# Patient Record
Sex: Male | Born: 1975 | Race: White | Hispanic: No | Marital: Single | Smoking: Current every day smoker
Health system: Southern US, Community
[De-identification: ages and names within clinical notes are randomized; demographics above are authoritative.]

## PROBLEM LIST (undated history)

## (undated) DIAGNOSIS — F32A Depression, unspecified: Secondary | ICD-10-CM

## (undated) HISTORY — PX: HERNIA REPAIR: SHX51

## (undated) HISTORY — DX: Depression, unspecified: F32.A

---

## 2009-11-23 ENCOUNTER — Emergency Department (HOSPITAL_COMMUNITY): Admission: EM | Admit: 2009-11-23 | Discharge: 2009-11-23 | Payer: Self-pay | Admitting: Emergency Medicine

## 2009-12-15 ENCOUNTER — Emergency Department (HOSPITAL_COMMUNITY): Admission: EM | Admit: 2009-12-15 | Discharge: 2009-12-15 | Payer: Self-pay | Admitting: Emergency Medicine

## 2009-12-18 ENCOUNTER — Emergency Department (HOSPITAL_COMMUNITY): Admission: EM | Admit: 2009-12-18 | Discharge: 2009-12-18 | Payer: Self-pay | Admitting: Emergency Medicine

## 2009-12-24 ENCOUNTER — Emergency Department (HOSPITAL_COMMUNITY): Admission: EM | Admit: 2009-12-24 | Discharge: 2009-12-24 | Payer: Self-pay | Admitting: Emergency Medicine

## 2010-02-04 ENCOUNTER — Emergency Department (HOSPITAL_COMMUNITY): Admission: EM | Admit: 2010-02-04 | Discharge: 2010-02-04 | Payer: Self-pay | Admitting: Emergency Medicine

## 2010-06-12 ENCOUNTER — Emergency Department: Payer: Self-pay | Admitting: Emergency Medicine

## 2010-06-23 ENCOUNTER — Emergency Department: Payer: Self-pay | Admitting: Emergency Medicine

## 2010-06-24 ENCOUNTER — Emergency Department: Payer: Self-pay | Admitting: Unknown Physician Specialty

## 2010-06-28 ENCOUNTER — Encounter: Payer: Self-pay | Admitting: Family Medicine

## 2010-06-28 ENCOUNTER — Ambulatory Visit
Admission: RE | Admit: 2010-06-28 | Discharge: 2010-06-28 | Payer: Self-pay | Source: Home / Self Care | Attending: Family Medicine | Admitting: Family Medicine

## 2010-06-28 ENCOUNTER — Emergency Department: Payer: Self-pay | Admitting: Emergency Medicine

## 2010-06-28 DIAGNOSIS — F329 Major depressive disorder, single episode, unspecified: Secondary | ICD-10-CM | POA: Insufficient documentation

## 2010-06-28 DIAGNOSIS — F4310 Post-traumatic stress disorder, unspecified: Secondary | ICD-10-CM

## 2010-06-28 DIAGNOSIS — R413 Other amnesia: Secondary | ICD-10-CM
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
## 2011-08-13 IMAGING — CR DG KNEE COMPLETE 4+V*L*
1 series · 4 of 4 positions shown · non-contrast
Comparison: none

REASON FOR EXAM: pain
COMMENTS:   May transport without cardiac monitor

PROCEDURE:     DXR - DXR KNEE LT COMP WITH OBLIQUES  - June 24, 2010 [DATE]
RESULT:     Comparison: None.

[Series 1: view not recorded · 0.17mm/px · 4 of 4 slices shown]
[im 1/4]
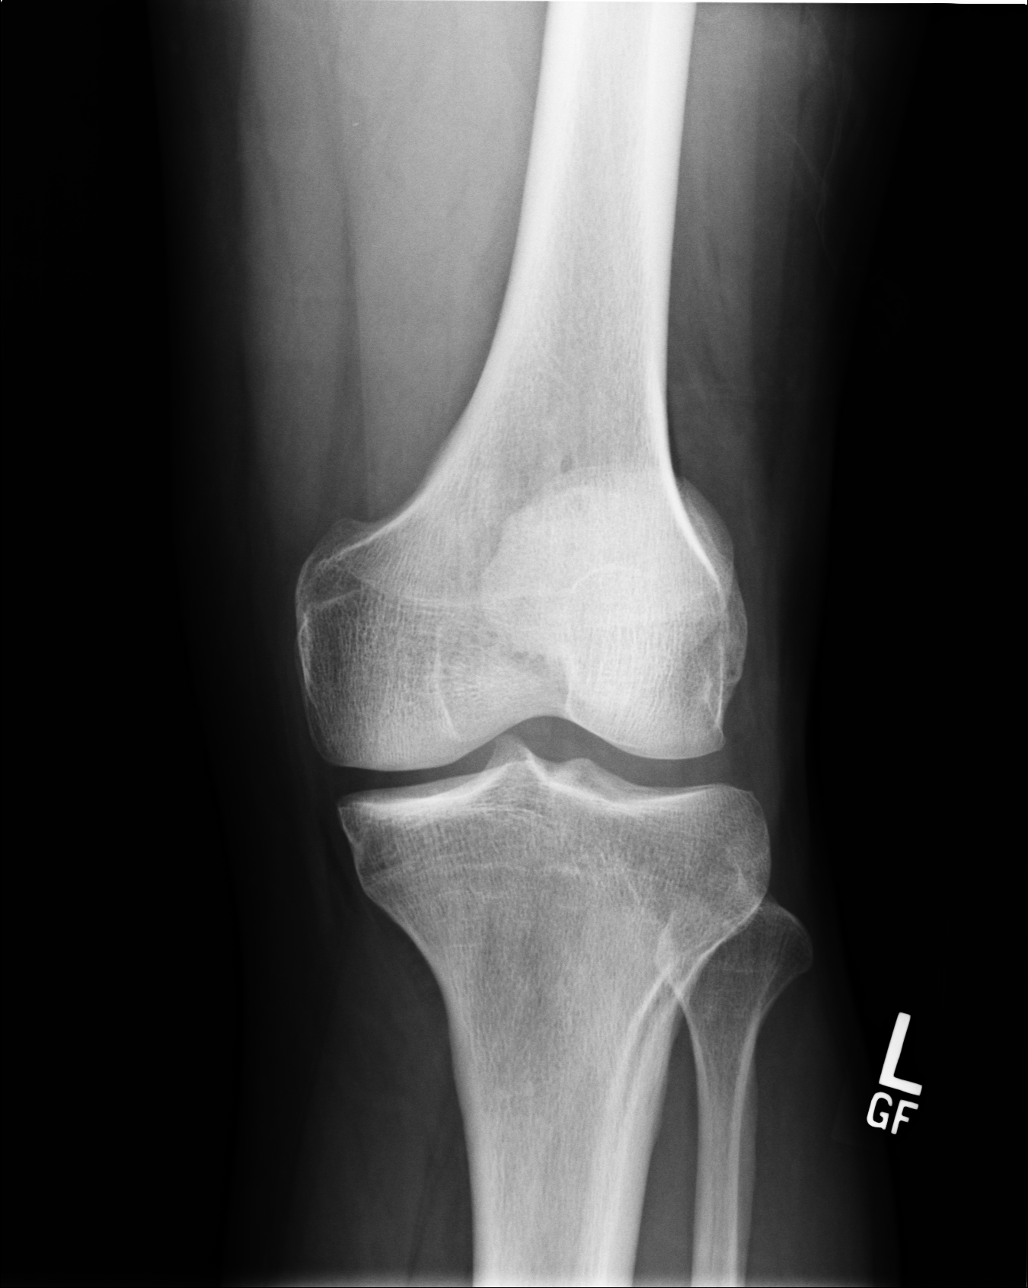
[im 2/4]
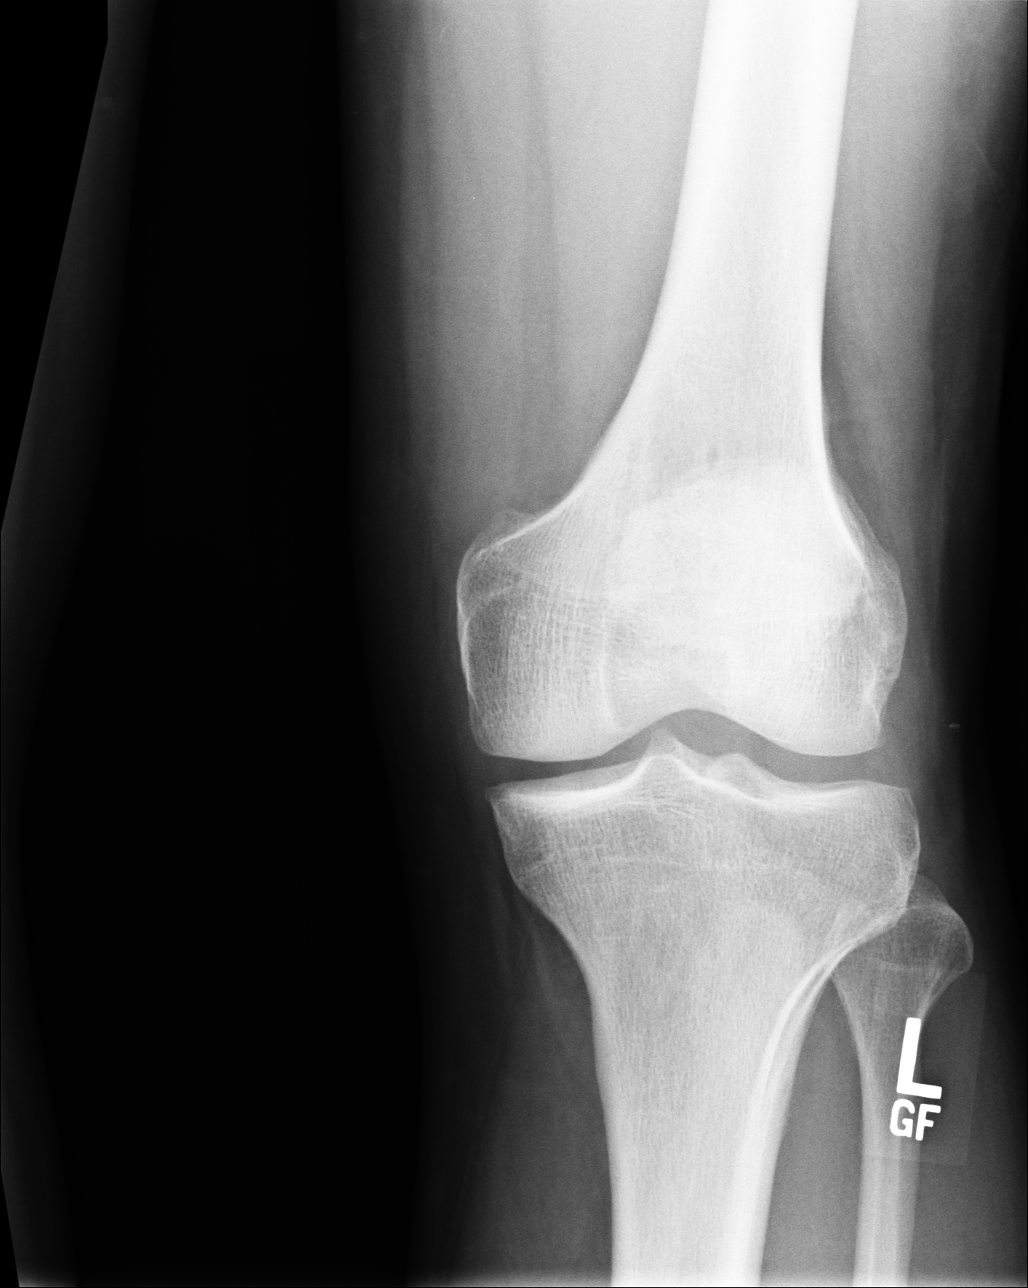
[im 3/4]
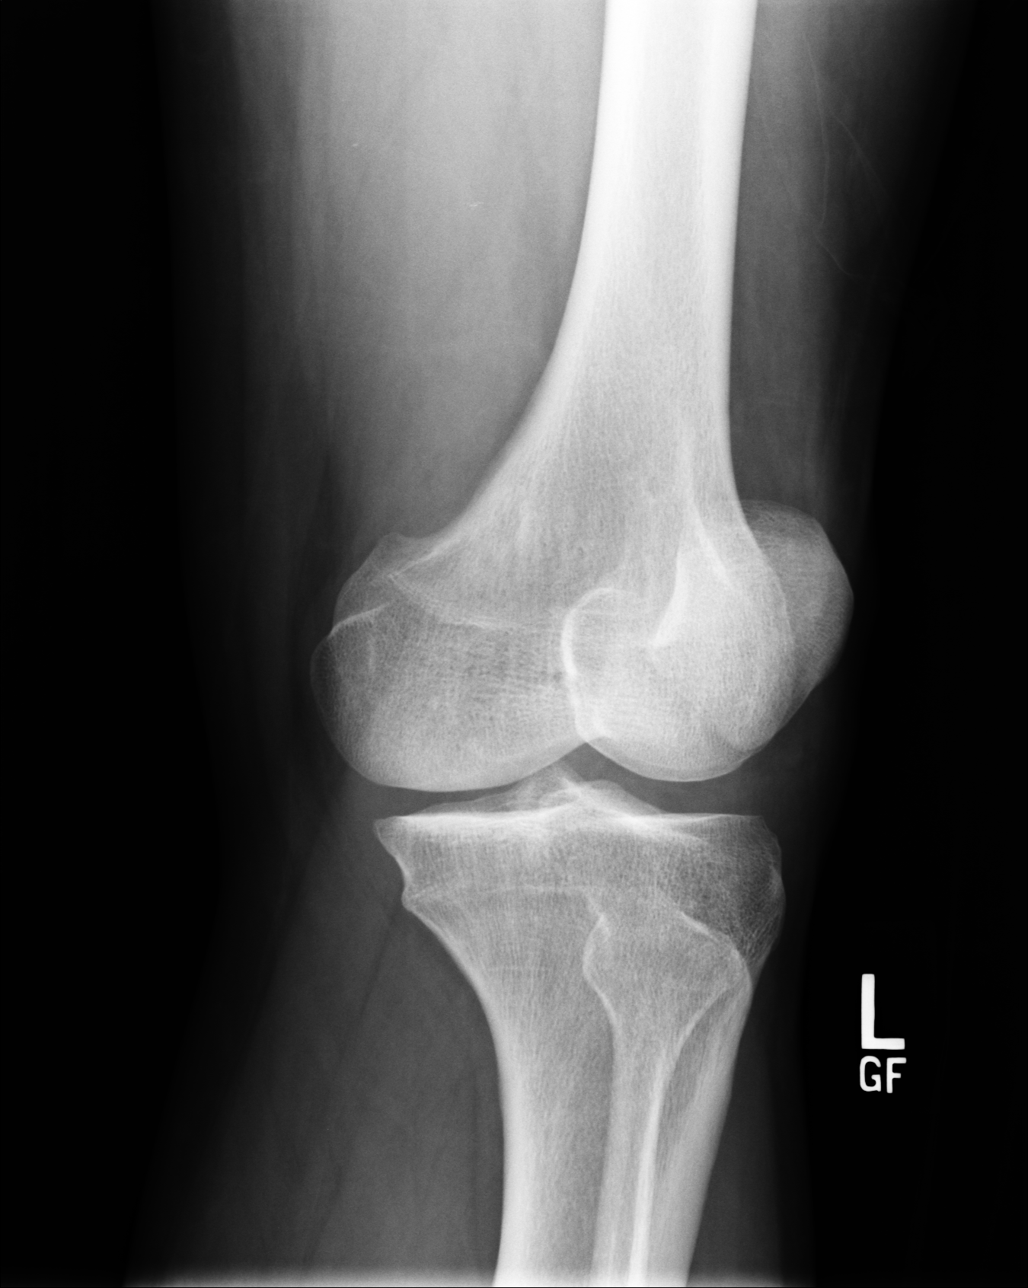
[im 4/4]
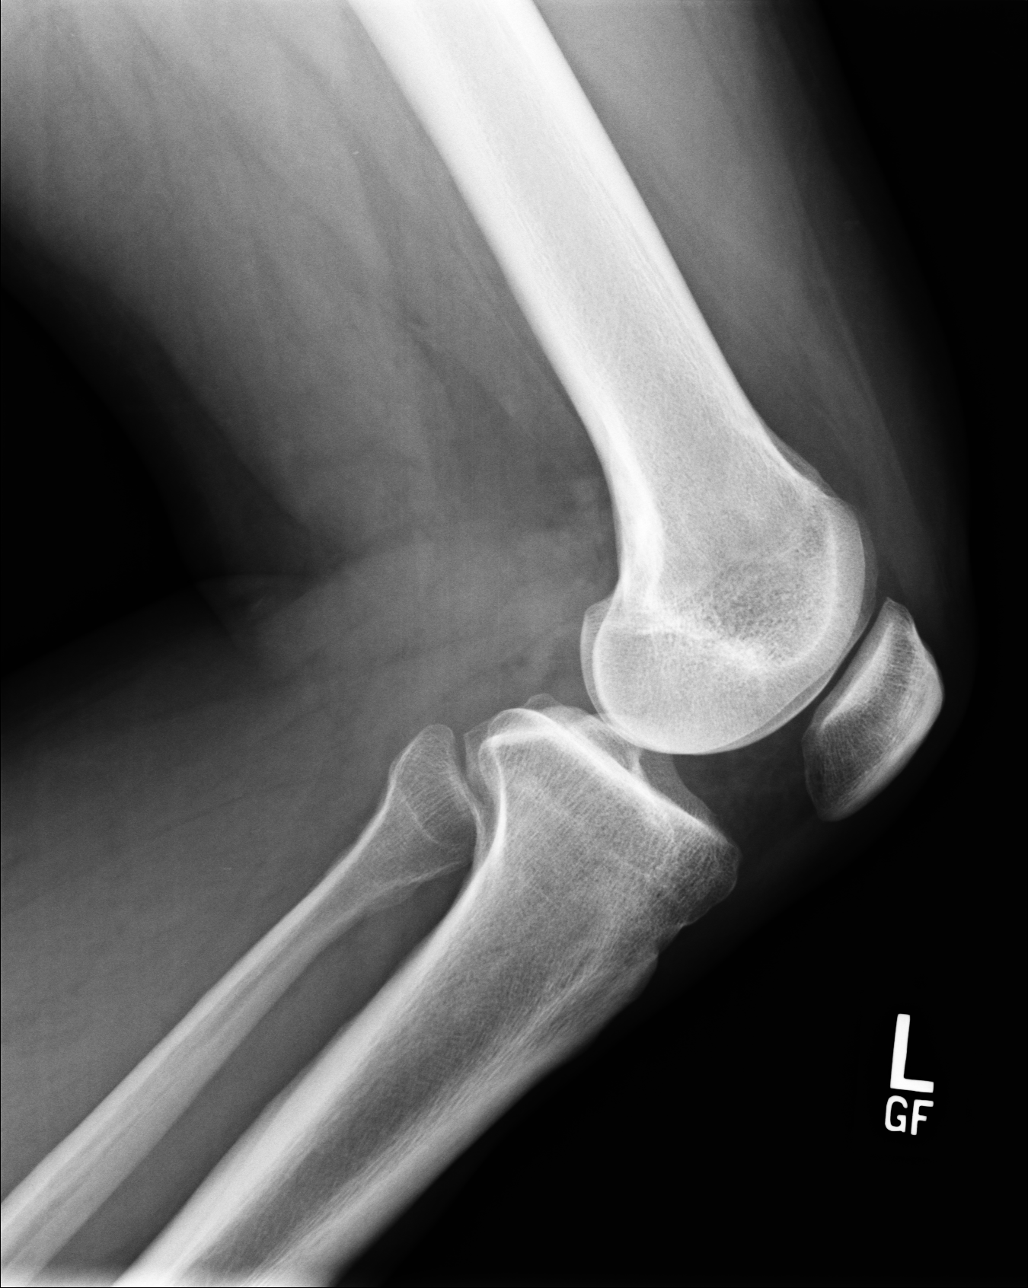

[4 of 4 positions shown; findings below may reference images not displayed]

FINDINGS: No acute fracture. Joint spaces are maintained. Negative for effusion.
IMPRESSION: No acute fracture.

## 2022-05-10 ENCOUNTER — Emergency Department (HOSPITAL_BASED_OUTPATIENT_CLINIC_OR_DEPARTMENT_OTHER)
Admission: EM | Admit: 2022-05-10 | Discharge: 2022-05-10 | Disposition: A | Discharge status: Home / Self Care | Payer: Self-pay | Attending: Emergency Medicine | Admitting: Emergency Medicine

## 2022-05-10 ENCOUNTER — Other Ambulatory Visit: Payer: Self-pay

## 2022-05-10 ENCOUNTER — Emergency Department (HOSPITAL_BASED_OUTPATIENT_CLINIC_OR_DEPARTMENT_OTHER): Payer: Self-pay

## 2022-05-10 ENCOUNTER — Encounter (HOSPITAL_BASED_OUTPATIENT_CLINIC_OR_DEPARTMENT_OTHER): Payer: Self-pay | Admitting: Emergency Medicine

## 2022-05-10 DIAGNOSIS — M5431 Sciatica, right side: Secondary | ICD-10-CM | POA: Insufficient documentation

## 2022-05-10 DIAGNOSIS — Z20822 Contact with and (suspected) exposure to covid-19: Secondary | ICD-10-CM | POA: Insufficient documentation

## 2022-05-10 DIAGNOSIS — J101 Influenza due to other identified influenza virus with other respiratory manifestations: Secondary | ICD-10-CM | POA: Insufficient documentation

## 2022-05-10 DIAGNOSIS — M5432 Sciatica, left side: Secondary | ICD-10-CM | POA: Insufficient documentation

## 2022-05-10 LAB — URINALYSIS, ROUTINE W REFLEX MICROSCOPIC
Bilirubin Urine: NEGATIVE
Glucose, UA: NEGATIVE mg/dL
Hgb urine dipstick: NEGATIVE
Ketones, ur: NEGATIVE mg/dL
Leukocytes,Ua: NEGATIVE
Nitrite: NEGATIVE
Protein, ur: 30 mg/dL — AB
Specific Gravity, Urine: >=1.03 (ref 1.005–1.030)
pH: 5 (ref 5.0–8.0)

## 2022-05-10 LAB — RESP PANEL BY RT-PCR (FLU A&B, COVID) ARPGX2
Influenza A by PCR: POSITIVE — AB
Influenza B by PCR: NEGATIVE
SARS Coronavirus 2 by RT PCR: NEGATIVE

## 2022-05-10 LAB — URINALYSIS, MICROSCOPIC (REFLEX): RBC / HPF: NONE SEEN RBC/hpf (ref 0–5)

## 2022-05-10 MED ORDER — IBUPROFEN 800 MG PO TABS: 800.0000 mg | ORAL_TABLET | Freq: Three times a day (TID) | ORAL | 0 refills | Status: AC

## 2022-05-10 MED ORDER — HYDROCODONE-ACETAMINOPHEN 5-325 MG PO TABS: 1.0000 | ORAL_TABLET | ORAL | 0 refills | Status: AC | PRN

## 2022-05-10 MED ORDER — KETOROLAC TROMETHAMINE 30 MG/ML IJ SOLN
30.0000 mg | Freq: Once | INTRAMUSCULAR | Status: AC
  Administered 2022-05-10: 30 mg via INTRAMUSCULAR
  Filled 2022-05-10: qty 1

## 2022-05-10 MED ORDER — DEXAMETHASONE SODIUM PHOSPHATE 10 MG/ML IJ SOLN
10.0000 mg | Freq: Once | INTRAMUSCULAR | Status: AC
  Administered 2022-05-10: 10 mg via INTRAMUSCULAR
  Filled 2022-05-10: qty 1

## 2022-05-10 MED ORDER — PREDNISONE 50 MG PO TABS: 50.0000 mg | ORAL_TABLET | Freq: Every day | ORAL | 0 refills | Status: AC

## 2022-05-10 NOTE — ED Notes (Signed)
ED Provider at bedside. 

## 2022-05-10 NOTE — ED Notes (Signed)
Pt discharged to home. Discharge instructions have been discussed with patient and/or family members. Pt verbally acknowledges understanding d/c instructions, and endorses comprehension to checkout at registration before leaving.  °

## 2022-05-10 NOTE — ED Triage Notes (Signed)
Pt arrives pov, to triage in wheelchair, c/o body aches and lower back pain x 3 days, difficulty ambulating. Endorses ibuprofen this am pta. Endorses cough that has resolved

## 2022-05-10 NOTE — ED Notes (Signed)
Patient transported to X-ray 

## 2022-05-10 NOTE — ED Provider Notes (Signed)
[facility] Provider Note   CSN: [id number] Arrival date & time: 05/10/22  0813     History  Chief Complaint  Patient presents with   Back Pain    Jared Robbins is a 46 y.o. male.  Pt is a 46 yo male with a pmhx significant for depression.  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  He has been around several people who have been coughing on him.  He started getting cough and fever on 11/2.  He's been living in a hotel as he's just here for work and has been lying in bed all weekend.  He's been taking tylenol and ibuprofen for fever and pain.  He developed low back pain with pain radiating down to both upper hamstrings.  He denies any numbness or weakness.  Pt said it hurts to walk or to move.  He was able to drive here.  He took 800 mg ibuprofen pta.  He's been taking dayquil and nyquil.       Home Medications Prior to Admission medications   Medication Sig Start Date End Date Taking? Authorizing Provider  HYDROcodone-acetaminophen (NORCO/VICODIN) 5-325 MG tablet Take 1 tablet by mouth every 4 (four) hours as needed. 05/10/22  Yes Isla Pence, MD  ibuprofen (ADVIL) 800 MG tablet Take 1 tablet (800 mg total) by mouth 3 (three) times daily. 05/10/22  Yes Isla Pence, MD  predniSONE (DELTASONE) 50 MG tablet Take 1 tablet (50 mg total) by mouth daily with breakfast. 05/10/22  Yes Isla Pence, MD      Allergies    Clindamycin    Review of Systems   Review of Systems  HENT:  Positive for congestion.   Musculoskeletal:  Positive for back pain.  All other systems reviewed and are negative.   Physical Exam Updated Vital Signs BP (!) 141/90   Pulse 86   Temp 98.1 F (36.7 C) (Oral)   Resp 20   Ht 6' (1.829 m)   Wt 104.3 kg   SpO2 95%   BMI 31.19 kg/m  Physical Exam Vitals and nursing note reviewed.  Constitutional:      Appearance: Normal appearance.  HENT:     Head: Normocephalic and  atraumatic.     Right Ear: External ear normal.     Left Ear: External ear normal.     Nose: Nose normal.     Mouth/Throat:     Mouth: Mucous membranes are moist.     Pharynx: Oropharynx is clear.  Eyes:     Extraocular Movements: Extraocular movements intact.     Conjunctiva/sclera: Conjunctivae normal.     Pupils: Pupils are equal, round, and reactive to light.  Cardiovascular:     Rate and Rhythm: Normal rate and regular rhythm.     Pulses: Normal pulses.     Heart sounds: Normal heart sounds.  Pulmonary:     Effort: Pulmonary effort is normal.     Breath sounds: Normal breath sounds.  Abdominal:     General: Abdomen is flat. Bowel sounds are normal.     Palpations: Abdomen is soft.  Musculoskeletal:        General: Normal range of motion.       Arms:     Cervical back: Normal range of motion and neck supple.  Skin:    General: Skin is warm.     Capillary Refill: Capillary refill takes less than 2 seconds.  Neurological:  General: No focal deficit present.     Mental Status: He is alert and oriented to person, place, and time.  Psychiatric:        Mood and Affect: Mood normal.        Behavior: Behavior normal.     ED Results / Procedures / Treatments   Labs (all labs ordered are listed, but only abnormal results are displayed) Labs Reviewed  RESP PANEL BY RT-PCR (FLU A&B, COVID) ARPGX2 - Abnormal; Notable for the following components:      Result Value   Influenza A by PCR POSITIVE (*)    All other components within normal limits  URINALYSIS, ROUTINE W REFLEX MICROSCOPIC - Abnormal; Notable for the following components:   Color, Urine AMBER (*)    Protein, ur 30 (*)    All other components within normal limits  URINALYSIS, MICROSCOPIC (REFLEX) - Abnormal; Notable for the following components:   Bacteria, UA FEW (*)    All other components within normal limits    EKG None  Radiology DG Chest 2 View  Result Date: 05/10/2022 CLINICAL DATA:  Cough EXAM:  CHEST - 2 VIEW COMPARISON:  06/24/2010 FINDINGS: The heart size and mediastinal contours are within normal limits. Both lungs are clear. The visualized skeletal structures are unremarkable. IMPRESSION: No active cardiopulmonary disease. Electronically Signed   By: Alcide Clever M.D.   On: 05/10/2022 09:10    Procedures Procedures    Medications Ordered in ED Medications  dexamethasone (DECADRON) injection 10 mg (has no administration in time range)  ketorolac (TORADOL) 30 MG/ML injection 30 mg (has no administration in time range)    ED Course/ Medical Decision Making/ A&P                           Medical Decision Making Amount and/or Complexity of Data Reviewed Labs: ordered. Radiology: ordered.  Risk Prescription drug management.   This patient presents to the ED for concern of back pain and cold sx, this involves an extensive number of treatment options, and is a complaint that carries with it a high risk of complications and morbidity.  The differential diagnosis includes covid/flu/uri   Co morbidities that complicate the patient evaluation  depression   Additional history obtained:  Additional history obtained from epic chart review   Lab Tests:  I Ordered, and personally interpreted labs.  The pertinent results include:  covid neg.  Flu +.   Imaging Studies ordered:  I ordered imaging studies including cxr  I independently visualized and interpreted imaging which showed nothing acute I agree with the radiologist interpretation  Medicines ordered and prescription drug management:  I ordered medication including decadron and toradol  for pain  Reevaluation of the patient after these medicines showed that the patient improved I have reviewed the patients home medicines and have made adjustments as needed   Test Considered:  cxr  Problem List / ED Course:  Influenza A:  pt is out of the window for tamiflu.  He will be treated with prednisone, ibuprofen,  and lortab.  He is to return if worse. Back pain:  likely from combo of coughing and lying in bed.  ? Acute sciatica, but no red flags.  Meds above will help treat.  Pt given rehab exercises.   Reevaluation:  After the interventions noted above, I reevaluated the patient and found that they have :improved   Social Determinants of Health:  No pcp/no insurance  Dispostion:  After consideration of the diagnostic results and the patients response to treatment, I feel that the patent would benefit from discharge with outpatient f/u.          Final Clinical Impression(s) / ED Diagnoses Final diagnoses:  Influenza A  Bilateral sciatica    Rx / DC Orders ED Discharge Orders          Ordered    ibuprofen (ADVIL) 800 MG tablet  3 times daily        05/10/22 1015    HYDROcodone-acetaminophen (NORCO/VICODIN) 5-325 MG tablet  Every 4 hours PRN        05/10/22 1015    predniSONE (DELTASONE) 50 MG tablet  Daily with breakfast        05/10/22 1015              Isla Pence, MD 05/10/22 1018
